# Patient Record
Sex: Male | Born: 1995
Health system: Southern US, Community
[De-identification: ages and names within clinical notes are randomized; demographics above are authoritative.]

## PROBLEM LIST (undated history)

## (undated) DIAGNOSIS — J302 Other seasonal allergic rhinitis: Secondary | ICD-10-CM

## (undated) DIAGNOSIS — F909 Attention-deficit hyperactivity disorder, unspecified type: Secondary | ICD-10-CM

## (undated) DIAGNOSIS — F913 Oppositional defiant disorder: Secondary | ICD-10-CM

## (undated) HISTORY — DX: Attention-deficit hyperactivity disorder, unspecified type: F90.9

## (undated) HISTORY — DX: Oppositional defiant disorder: F91.3

## (undated) HISTORY — DX: Other seasonal allergic rhinitis: J30.2

---
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
## 2014-03-07 ENCOUNTER — Emergency Department (HOSPITAL_COMMUNITY)
Admission: EM | Admit: 2014-03-07 | Discharge: 2014-03-07 | Disposition: A | Discharge status: Home / Self Care | Payer: Medicaid Other | Source: Home / Self Care | Attending: Emergency Medicine | Admitting: Emergency Medicine

## 2014-03-07 ENCOUNTER — Encounter (HOSPITAL_COMMUNITY): Payer: Self-pay | Admitting: Emergency Medicine

## 2014-03-07 DIAGNOSIS — J00 Acute nasopharyngitis [common cold]: Secondary | ICD-10-CM

## 2014-03-07 MED ORDER — IPRATROPIUM BROMIDE 0.06 % NA SOLN: 2.0000 | NASAL | Status: DC | PRN

## 2014-03-07 NOTE — Discharge Instructions (Signed)
Upper Respiratory Infection, Adult An upper respiratory infection (URI) is also sometimes known as the common cold. The upper respiratory tract includes the nose, sinuses, throat, trachea, and bronchi. Bronchi are the airways leading to the lungs. Most people improve within 1 week, but symptoms can last up to 2 weeks. A residual cough may last even longer.  CAUSES Many different viruses can infect the tissues lining the upper respiratory tract. The tissues become irritated and inflamed and often become very moist. Mucus production is also common. A cold is contagious. You can easily spread the virus to others by oral contact. This includes kissing, sharing a glass, coughing, or sneezing. Touching your mouth or nose and then touching a surface, which is then touched by another person, can also spread the virus. SYMPTOMS  Symptoms typically develop 1 to 3 days after you come in contact with a cold virus. Symptoms vary from person to person. They may include:  Runny nose.  Sneezing.  Nasal congestion.  Sinus irritation.  Sore throat.  Loss of voice (laryngitis).  Cough.  Fatigue.  Muscle aches.  Loss of appetite.  Headache.  Low-grade fever. DIAGNOSIS  You might diagnose your own cold based on familiar symptoms, since most people get a cold 2 to 3 times a year. Your caregiver can confirm this based on your exam. Most importantly, your caregiver can check that your symptoms are not due to another disease such as strep throat, sinusitis, pneumonia, asthma, or epiglottitis. Blood tests, throat tests, and X-rays are not necessary to diagnose a common cold, but they may sometimes be helpful in excluding other more serious diseases. Your caregiver will decide if any further tests are required. RISKS AND COMPLICATIONS  You may be at risk for a more severe case of the common cold if you smoke cigarettes, have chronic heart disease (such as heart failure) or lung disease (such as asthma), or if  you have a weakened immune system. The very young and very old are also at risk for more serious infections. Bacterial sinusitis, middle ear infections, and bacterial pneumonia can complicate the common cold. The common cold can worsen asthma and chronic obstructive pulmonary disease (COPD). Sometimes, these complications can require emergency medical care and may be life-threatening. PREVENTION  The best way to protect against getting a cold is to practice good hygiene. Avoid oral or hand contact with people with cold symptoms. Wash your hands often if contact occurs. There is no clear evidence that vitamin C, vitamin E, echinacea, or exercise reduces the chance of developing a cold. However, it is always recommended to get plenty of rest and practice good nutrition. TREATMENT  Treatment is directed at relieving symptoms. There is no cure. Antibiotics are not effective, because the infection is caused by a virus, not by bacteria. Treatment may include:  Increased fluid intake. Sports drinks offer valuable electrolytes, sugars, and fluids.  Breathing heated mist or steam (vaporizer or shower).  Eating chicken soup or other clear broths, and maintaining good nutrition.  Getting plenty of rest.  Using gargles or lozenges for comfort.  Controlling fevers with ibuprofen or acetaminophen as directed by your caregiver.  Increasing usage of your inhaler if you have asthma. Zinc gel and zinc lozenges, taken in the first 24 hours of the common cold, can shorten the duration and lessen the severity of symptoms. Pain medicines may help with fever, muscle aches, and throat pain. A variety of non-prescription medicines are available to treat congestion and runny nose. Your caregiver   can make recommendations and may suggest nasal or lung inhalers for other symptoms.  HOME CARE INSTRUCTIONS   Only take over-the-counter or prescription medicines for pain, discomfort, or fever as directed by your  caregiver.  Use a warm mist humidifier or inhale steam from a shower to increase air moisture. This may keep secretions moist and make it easier to breathe.  Drink enough water and fluids to keep your urine clear or pale yellow.  Rest as needed.  Return to work when your temperature has returned to normal or as your caregiver advises. You may need to stay home longer to avoid infecting others. You can also use a face mask and careful hand washing to prevent spread of the virus. SEEK MEDICAL CARE IF:   After the first few days, you feel you are getting worse rather than better.  You need your caregiver's advice about medicines to control symptoms.  You develop chills, worsening shortness of breath, or brown or red sputum. These may be signs of pneumonia.  You develop yellow or brown nasal discharge or pain in the face, especially when you bend forward. These may be signs of sinusitis.  You develop a fever, swollen neck glands, pain with swallowing, or white areas in the back of your throat. These may be signs of strep throat. SEEK IMMEDIATE MEDICAL CARE IF:   You have a fever.  You develop severe or persistent headache, ear pain, sinus pain, or chest pain.  You develop wheezing, a prolonged cough, cough up blood, or have a change in your usual mucus (if you have chronic lung disease).  You develop sore muscles or a stiff neck. Document Released: 06/28/2000 Document Revised: 03/27/2011 Document Reviewed: 04/09/2013 Exit[employer] Patient Information 2015 [employer]. This information is not intended to replace advice given to you by your health care provider. Make sure you discuss any questions you have with your health care provider.  

## 2014-03-07 NOTE — ED Provider Notes (Signed)
CSN: [id number]     Arrival date & time 03/07/14  1213 History   First MD Initiated Contact with Patient 03/07/14 1232     Chief Complaint  Patient presents with  . Cough   (Consider location/radiation/quality/duration/timing/severity/associated sxs/prior Treatment) HPI      19 year old male presents for evaluation of runny nose and a mild cough. This started yesterday. He is not taking any over-the-counter medications for treatment. No fever, shortness of breath, NVD, or sinus pressure. No recent travel. He has sick contacts.  Past Medical History  Diagnosis Date  . ADHD (attention deficit hyperactivity disorder)   . Oppositional defiant disorder   . Asthma   . Seasonal allergies    History reviewed. No pertinent past surgical history. Family History  Problem Relation Age of Onset  . ADD / ADHD Mother   . Depression Mother    History  Substance Use Topics  . Smoking status: Current Every Day Smoker  . Smokeless tobacco: Not on file  . Alcohol Use: Yes    Review of Systems  Constitutional: Negative for chills and fatigue.  HENT: Positive for congestion, rhinorrhea and sore throat. Negative for ear pain.   Respiratory: Positive for cough. Negative for shortness of breath.   Cardiovascular: Negative for chest pain.  All other systems reviewed and are negative.   Allergies  Review of patient's allergies indicates no known allergies.  Home Medications   Prior to Admission medications   Medication Sig Start Date End Date Taking? Authorizing Provider  albuterol (PROVENTIL HFA;VENTOLIN HFA) 108 (90 BASE) MCG/ACT inhaler Inhale 2 puffs into the lungs every 6 (six) hours as needed for wheezing.    Historical Provider, MD  beclomethasone (QVAR) 80 MCG/ACT inhaler Inhale 1 puff into the lungs daily as needed. For shortness of breath    Historical Provider, MD  fexofenadine (ALLEGRA) 180 MG tablet Take 180 mg by mouth daily.    Historical Provider, MD  ipratropium (ATROVENT) 0.06  % nasal spray Place 2 sprays into both nostrils every 4 (four) hours as needed for rhinitis. 03/07/14   Graylon GoodZachary H Jackie Russman, PA-C  Multiple Vitamin (MULTIVITAMIN WITH MINERALS) TABS Take 1 tablet by mouth daily.    Historical Provider, MD  predniSONE (DELTASONE) 20 MG tablet Take 3 tablets (60 mg total) by mouth daily. 05/10/12   Sunnie NielsenBrian Opitz, MD  vitamin C (ASCORBIC ACID) 500 MG tablet Take 500 mg by mouth daily.    Historical Provider, MD   BP 126/69 mmHg  Pulse 96  Temp(Src) 98.2 F (36.8 C) (Oral)  Resp 18  SpO2 100% Physical Exam  Constitutional: He is oriented to person, place, and time. He appears well-developed and well-nourished. No distress.  HENT:  Head: Normocephalic and atraumatic.  Right Ear: External ear normal.  Left Ear: External ear normal.  Nose: Nose normal.  Mouth/Throat: Oropharynx is clear and moist. No oropharyngeal exudate.  Cardiovascular: Normal rate, regular rhythm and normal heart sounds.   Pulmonary/Chest: Effort normal and breath sounds normal. No respiratory distress.  Lymphadenopathy:    He has no cervical adenopathy.  Neurological: He is alert and oriented to person, place, and time. Coordination normal.  Skin: Skin is warm and dry. No rash noted. He is not diaphoretic.  Psychiatric: He has a normal mood and affect. Judgment normal.  Nursing note and vitals reviewed.   ED Course  Procedures (including critical care time) Labs Review Labs Reviewed - No data to display  Imaging Review No results found.   MDM  1. Acute nasopharyngitis (common cold)    He has a cold, I will prescribe Atrovent nasal spray to help with the rhinorrhea, otherwise follow-up when necessary  Meds ordered this encounter  Medications  . ipratropium (ATROVENT) 0.06 % nasal spray    Sig: Place 2 sprays into both nostrils every 4 (four) hours as needed for rhinitis.    Dispense:  15 mL    Refill:  1       Graylon Good, PA-C 03/07/14 1314

## 2014-03-07 NOTE — ED Notes (Signed)
Pt states that for the past 2 days he has had a cough, nose has been congested, along with him sneezing. Pt denies any nausea, emesis and diarrhea.

## 2014-04-17 ENCOUNTER — Encounter (HOSPITAL_COMMUNITY): Payer: Self-pay

## 2014-04-17 ENCOUNTER — Emergency Department (HOSPITAL_COMMUNITY)
Admission: EM | Admit: 2014-04-17 | Discharge: 2014-04-17 | Disposition: A | Discharge status: Home / Self Care | Payer: Medicaid Other | Attending: Emergency Medicine | Admitting: Emergency Medicine

## 2014-04-17 DIAGNOSIS — J45901 Unspecified asthma with (acute) exacerbation: Secondary | ICD-10-CM

## 2014-04-17 DIAGNOSIS — Z7951 Long term (current) use of inhaled steroids: Secondary | ICD-10-CM | POA: Insufficient documentation

## 2014-04-17 DIAGNOSIS — Z7952 Long term (current) use of systemic steroids: Secondary | ICD-10-CM | POA: Insufficient documentation

## 2014-04-17 DIAGNOSIS — Z79899 Other long term (current) drug therapy: Secondary | ICD-10-CM | POA: Insufficient documentation

## 2014-04-17 DIAGNOSIS — Z8639 Personal history of other endocrine, nutritional and metabolic disease: Secondary | ICD-10-CM | POA: Insufficient documentation

## 2014-04-17 DIAGNOSIS — Z720 Tobacco use: Secondary | ICD-10-CM | POA: Insufficient documentation

## 2014-04-17 MED ORDER — ALBUTEROL SULFATE (2.5 MG/3ML) 0.083% IN NEBU
5.0000 mg | INHALATION_SOLUTION | Freq: Once | RESPIRATORY_TRACT | Status: AC
  Administered 2014-04-17: 5 mg via RESPIRATORY_TRACT
  Filled 2014-04-17: qty 6

## 2014-04-17 MED ORDER — IPRATROPIUM-ALBUTEROL 0.5-2.5 (3) MG/3ML IN SOLN
3.0000 mL | Freq: Once | RESPIRATORY_TRACT | Status: AC
  Administered 2014-04-17: 3 mL via RESPIRATORY_TRACT
  Filled 2014-04-17: qty 3

## 2014-04-17 MED ORDER — ALBUTEROL SULFATE HFA 108 (90 BASE) MCG/ACT IN AERS: 1.0000 | INHALATION_SPRAY | Freq: Four times a day (QID) | RESPIRATORY_TRACT | Status: DC | PRN

## 2014-04-17 MED ORDER — DEXAMETHASONE 4 MG PO TABS
10.0000 mg | ORAL_TABLET | Freq: Once | ORAL | Status: AC
  Administered 2014-04-17: 10 mg via ORAL
  Filled 2014-04-17: qty 2
  Filled 2014-04-17: qty 1

## 2014-04-17 NOTE — ED Provider Notes (Signed)
CSN: [id number]     Arrival date & time 04/17/14  0419 History   First MD Initiated Contact with Patient 04/17/14 [phone]     Chief Complaint  Patient presents with  . Asthma     (Consider location/radiation/quality/duration/timing/severity/associated sxs/prior Treatment) Patient is a 19 y.o. male presenting with asthma. The history is provided by the patient.  Asthma This is a recurrent problem. The current episode started yesterday. The problem occurs constantly. The problem has not changed since onset.Associated symptoms include shortness of breath. Pertinent negatives include no chest pain, no abdominal pain and no headaches. Nothing aggravates the symptoms. Nothing relieves the symptoms. He has tried nothing for the symptoms. The treatment provided no relief.    Past Medical History  Diagnosis Date  . ADHD (attention deficit hyperactivity disorder)   . Oppositional defiant disorder   . Asthma   . Seasonal allergies    History reviewed. No pertinent past surgical history. Family History  Problem Relation Age of Onset  . ADD / ADHD Mother   . Depression Mother    History  Substance Use Topics  . Smoking status: Current Every Day Smoker  . Smokeless tobacco: Not on file  . Alcohol Use: Yes    Review of Systems  Constitutional: Negative for fever.  HENT: Negative for drooling and rhinorrhea.   Eyes: Negative for pain.  Respiratory: Positive for cough, shortness of breath and wheezing.   Cardiovascular: Negative for chest pain and leg swelling.  Gastrointestinal: Negative for nausea, vomiting, abdominal pain and diarrhea.  Genitourinary: Negative for dysuria and hematuria.  Musculoskeletal: Negative for gait problem and neck pain.  Skin: Negative for color change.  Neurological: Negative for numbness and headaches.  Hematological: Negative for adenopathy.  Psychiatric/Behavioral: Negative for behavioral problems.  All other systems reviewed and are  negative.     Allergies  Review of patient's allergies indicates no known allergies.  Home Medications   Prior to Admission medications   Medication Sig Start Date End Date Taking? Authorizing Provider  albuterol (PROVENTIL HFA;VENTOLIN HFA) 108 (90 BASE) MCG/ACT inhaler Inhale 2 puffs into the lungs every 6 (six) hours as needed for wheezing.   Yes Historical Provider, MD  beclomethasone (QVAR) 80 MCG/ACT inhaler Inhale 1 puff into the lungs daily as needed. For shortness of breath   Yes Historical Provider, MD  fexofenadine (ALLEGRA) 180 MG tablet Take 180 mg by mouth daily.   Yes Historical Provider, MD  ipratropium (ATROVENT) 0.06 % nasal spray Place 2 sprays into both nostrils every 4 (four) hours as needed for rhinitis. 03/07/14  Yes Adrian Blackwater Baker, PA-C  montelukast (SINGULAIR) 10 MG tablet Take 10 mg by mouth at bedtime.   Yes Historical Provider, MD  Multiple Vitamin (MULTIVITAMIN WITH MINERALS) TABS Take 1 tablet by mouth daily.   Yes Historical Provider, MD  vitamin C (ASCORBIC ACID) 500 MG tablet Take 500 mg by mouth daily.   Yes Historical Provider, MD  predniSONE (DELTASONE) 20 MG tablet Take 3 tablets (60 mg total) by mouth daily. Patient not taking: Reported on 04/17/2014 05/10/12   Sunnie Nielsen, MD   BP 113/66 mmHg  Pulse 99  Temp(Src) 99.1 F (37.3 C) (Oral)  Resp 18  SpO2 100% Physical Exam  Constitutional: He is oriented to person, place, and time. He appears well-developed and well-nourished.  HENT:  Head: Normocephalic and atraumatic.  Right Ear: External ear normal.  Left Ear: External ear normal.  Nose: Nose normal.  Mouth/Throat: Oropharynx is clear and moist.  No oropharyngeal exudate.  Eyes: Conjunctivae and EOM are normal. Pupils are equal, round, and reactive to light.  Neck: Normal range of motion. Neck supple.  Cardiovascular: Normal rate, regular rhythm, normal heart sounds and intact distal pulses.  Exam reveals no gallop and no friction rub.   No  murmur heard. Pulmonary/Chest: Effort normal. No respiratory distress. He has wheezes (mild expiratory wheeze heard bilaterally.).  Abdominal: Soft. Bowel sounds are normal. He exhibits no distension. There is no tenderness. There is no rebound and no guarding.  Musculoskeletal: Normal range of motion. He exhibits no edema or tenderness.  Neurological: He is alert and oriented to person, place, and time.  Skin: Skin is warm and dry.  Psychiatric: He has a normal mood and affect. His behavior is normal.  Nursing note and vitals reviewed.   ED Course  Procedures (including critical care time) Labs Review Labs Reviewed - No data to display  Imaging Review No results found.   EKG Interpretation None      MDM   Final diagnoses:  Asthma exacerbation    6:41 AM 19 y.o. male with a history of asthma who presents with cough since yesterday morning. He also notes some intermittent shortness of breath throughout the day yesterday which worsened last night. His symptoms are consistent with previous asthma exacerbations. He notes that he has not had an inhaler in the last 1-2 weeks. He is afebrile and vital signs are unremarkable here. He feels better after breathing treatment but requests another. Faint expiratory wheezing heard on exam. Given another breathing treatment and a dose of Decadron here.  7:06 AM:  I have discussed the diagnosis/risks/treatment options with the patient and believe the pt to be eligible for discharge home to follow-up with his pcp as needed. We also discussed returning to the ED immediately if new or worsening sx occur. We discussed the sx which are most concerning (e.g., worsening sob, cp, fever) that necessitate immediate return. Medications administered to the patient during their visit and any new prescriptions provided to the patient are listed below.  Medications given during this visit Medications  ipratropium-albuterol (DUONEB) 0.5-2.5 (3) MG/3ML nebulizer  solution 3 mL (3 mLs Nebulization Given 04/17/14 0445)  dexamethasone (DECADRON) tablet 10 mg (10 mg Oral Given 04/17/14 0640)  albuterol (PROVENTIL) (2.5 MG/3ML) 0.083% nebulizer solution 5 mg (5 mg Nebulization Given 04/17/14 0640)    New Prescriptions   ALBUTEROL (PROVENTIL HFA;VENTOLIN HFA) 108 (90 BASE) MCG/ACT INHALER    Inhale 1-2 puffs into the lungs every 6 (six) hours as needed for wheezing or shortness of breath.     Purvis SheffieldForrest Clinton Wahlberg, MD 04/18/14 1058

## 2014-04-17 NOTE — ED Notes (Signed)
Pt states he has a inhaler her usually uses at home but it was left in his work bag which is in a coworkers car and he has not been able to get it, states during this time of year his asthma flares up, dry cough noted.

## 2014-04-17 NOTE — ED Notes (Signed)
Pt states he started coughing and wheezing yesterday, he left his inhaler at work

## 2014-04-17 NOTE — ED Notes (Signed)
Pt feels better after breathing treatment

## 2014-04-17 NOTE — Discharge Instructions (Signed)

## 2016-04-06 ENCOUNTER — Encounter: Payer: Self-pay | Admitting: Family Medicine

## 2016-04-06 ENCOUNTER — Ambulatory Visit: Payer: Self-pay | Attending: Family Medicine | Admitting: Family Medicine

## 2016-04-06 VITALS — BP 111/71 | HR 83 | Temp 98.3°F | Resp 18 | Ht 67.0 in | Wt 168.8 lb

## 2016-04-06 DIAGNOSIS — H532 Diplopia: Secondary | ICD-10-CM | POA: Insufficient documentation

## 2016-04-06 DIAGNOSIS — J4530 Mild persistent asthma, uncomplicated: Secondary | ICD-10-CM

## 2016-04-06 DIAGNOSIS — J45909 Unspecified asthma, uncomplicated: Secondary | ICD-10-CM | POA: Insufficient documentation

## 2016-04-06 DIAGNOSIS — T50905A Adverse effect of unspecified drugs, medicaments and biological substances, initial encounter: Secondary | ICD-10-CM

## 2016-04-06 DIAGNOSIS — J302 Other seasonal allergic rhinitis: Secondary | ICD-10-CM

## 2016-04-06 DIAGNOSIS — Z7689 Persons encountering health services in other specified circumstances: Secondary | ICD-10-CM | POA: Insufficient documentation

## 2016-04-06 DIAGNOSIS — Z79899 Other long term (current) drug therapy: Secondary | ICD-10-CM | POA: Insufficient documentation

## 2016-04-06 DIAGNOSIS — H538 Other visual disturbances: Secondary | ICD-10-CM

## 2016-04-06 DIAGNOSIS — F1290 Cannabis use, unspecified, uncomplicated: Secondary | ICD-10-CM | POA: Insufficient documentation

## 2016-04-06 MED ORDER — FLUTICASONE PROPIONATE HFA 44 MCG/ACT IN AERO: 2.0000 | INHALATION_SPRAY | Freq: Two times a day (BID) | RESPIRATORY_TRACT | 12 refills | Status: AC

## 2016-04-06 MED ORDER — FLUTICASONE PROPIONATE 50 MCG/ACT NA SUSP: 2.0000 | Freq: Every day | NASAL | 6 refills | Status: AC

## 2016-04-06 MED ORDER — ALBUTEROL SULFATE HFA 108 (90 BASE) MCG/ACT IN AERS: 2.0000 | INHALATION_SPRAY | Freq: Four times a day (QID) | RESPIRATORY_TRACT | 2 refills | Status: AC | PRN

## 2016-04-06 MED ORDER — MONTELUKAST SODIUM 10 MG PO TABS: 10.0000 mg | ORAL_TABLET | Freq: Every day | ORAL | 6 refills | Status: AC

## 2016-04-06 MED FILL — !VENTOLIN HFA INHALER: 108 (90 BAS | 25 days supply | Qty: 18 | Fill #0

## 2016-04-06 MED FILL — FLUTICASONE PROP 50 MCG SPR: 50 | 30 days supply | Qty: 16 | Fill #0

## 2016-04-06 MED FILL — MONTELUKAST SOD 10 MG TAB: 10 | 30 days supply | Qty: 30 | Fill #0

## 2016-04-06 MED FILL — !FLOVENT HFA 44 MCG INHALER: 44 MCG | 30 days supply | Qty: 1 | Fill #0

## 2016-04-06 NOTE — Progress Notes (Signed)
Patient is here for Asthma & allergies  Patient has taking his allergies medication for today  Patient denies pain for today  Patient has eaten today

## 2016-04-06 NOTE — Patient Instructions (Addendum)
Asthma Attack Prevention, Adult Although you may not be able to control the fact that you have asthma, you can take actions to prevent episodes of asthma (asthma attacks). These actions include:  Creating a written plan for managing and treating your asthma attacks (asthma action plan).  Monitoring your asthma.  Avoiding things that can irritate your airways or make your asthma symptoms worse (asthma triggers).  Taking your medicines as directed.  Acting quickly if you have signs or symptoms of an asthma attack. What are some ways to prevent an asthma attack? Create a plan Work with your health care provider to create an asthma action plan. This plan should include:  A list of your asthma triggers and how to avoid them.  A list of symptoms that you experience during an asthma attack.  Information about when to take medicine and how much medicine to take.  Information to help you understand your peak flow measurements.  Contact information for your health care providers.  Daily actions that you can take to control asthma. Monitor your asthma   To monitor your asthma:  Use your peak flow meter every morning and every evening for 2-3 weeks. Record the results in a journal. A drop in your peak flow numbers on one or more days may mean that you are starting to have an asthma attack, even if you are not having symptoms.  When you have asthma symptoms, write them down in a journal. Avoid asthma triggers   Work with your health care provider to find out what your asthma triggers are. This can be done by:  Being tested for allergies.  Keeping a journal that notes when asthma attacks occur and what may have contributed to them.  Asking your health care provider whether other medical conditions make your asthma worse. Common asthma triggers include:  Dust.  Smoke. This includes campfire smoke and secondhand smoke from tobacco products.  Pet dander.  Trees, grasses or  pollens.  Very cold, dry, or humid air.  Mold.  Foods that contain high amounts of sulfites.  Strong smells.  Engine exhaust and air pollution.  Aerosol sprays and fumes from household cleaners.  Household pests and their droppings, including dust mites and cockroaches.  Certain medicines, including NSAIDs. Once you have determined your asthma triggers, take steps to avoid them. Depending on your triggers, you may be able to reduce the chance of an asthma attack by:  Keeping your home clean. Have someone dust and vacuum your home for you 1 or 2 times a week. If possible, have them use a high-efficiency particulate arrestance (HEPA) vacuum.  Washing your sheets weekly in hot water.  Using allergy-proof mattress covers and casings on your bed.  Keeping pets out of your home.  Taking care of mold and water problems in your home.  Avoiding areas where people smoke.  Avoiding using strong perfumes or odor sprays.  Avoid spending a lot of time outdoors when pollen counts are high and on very windy days.  Talking with your health care provider before stopping or starting any new medicines. Medicines Take over-the-counter and prescription medicines only as told by your health care provider. Many asthma attacks can be prevented by carefully following your medicine schedule. Taking your medicines correctly is especially important when you cannot avoid certain asthma triggers. Even if you are doing well, do not stop taking your medicine and do not take less medicine. Act quickly If an asthma attack happens, acting quickly can decrease how severe   it is and how long it lasts. Take these actions:  Pay attention to your symptoms. If you are coughing, wheezing, or having difficulty breathing, do not wait to see if your symptoms go away on their own. Follow your asthma action plan.  If you have followed your asthma action plan and your symptoms are not improving, call your health care  provider or seek immediate medical care at the nearest hospital. It is important to write down how often you need to use your fast-acting rescue inhaler. You can track how often you use an inhaler in your journal. If you are using your rescue inhaler more often, it may mean that your asthma is not under control. Adjusting your asthma treatment plan may help you to prevent future asthma attacks and help you to gain better control of your condition. How can I prevent an asthma attack when I exercise?   Exercise is a common asthma trigger. To prevent asthma attacks during exercise:  Follow advice from your health care provider about whether you should use your fast-acting inhaler before exercising. Many people with asthma experience exercise-induced bronchoconstriction (EIB). This condition often worsens during vigorous exercise in cold, humid, or dry environments. Usually, people with EIB can stay very active by using a fast-acting inhaler before exercising.  Avoid exercising outdoors in very cold or humid weather.  Avoid exercising outdoors when pollen counts are high.  Warm up and cool down when exercising.  Stop exercising right away if asthma symptoms start. Consider taking part in exercises that are less likely to cause asthma symptoms such as:  Indoor swimming.  Biking.  Walking.  Hiking.  Playing football. This information is not intended to replace advice given to you by your health care provider. Make sure you discuss any questions you have with your health care provider. Document Released: 12/21/2008 Document Revised: 09/03/2015 Document Reviewed: 06/19/2015 Elsevier Interactive Patient Education  2017 [employer]  

## 2016-04-06 NOTE — Progress Notes (Signed)
Subjective:  Patient ID: Christian Barajas, male    DOB: 1995-12-09  Age: 21 y.o. MRN: [id number]  CC: Establish Care   HPI Christian Barajas presents for   Asthma & Allegies: He reports being diagnosed with asthma at age 47. Last time he used his rescue inhaler was early Fall 2017. Aggravating factors include harsh chemicals, grasses, pollen, aand plants. Denies any current symptoms. Reports when symptoms occur they worsen in the spring months.   Vision change: He reports 1 week ago one episode of double vision when he was looking in the mirror at raised his head up. Denies any difficulty maintaining his balance, N/V, or headaches. He was asked about drug/alcohol use. Patient reports smoking THC earlier that same day. Denies any episodes after.    Outpatient Medications Prior to Visit  Medication Sig Dispense Refill  . fexofenadine (ALLEGRA) 180 MG tablet Take 180 mg by mouth daily.    . Multiple Vitamin (MULTIVITAMIN WITH MINERALS) TABS Take 1 tablet by mouth daily.    . predniSONE (DELTASONE) 20 MG tablet Take 3 tablets (60 mg total) by mouth daily. (Patient not taking: Reported on 04/17/2014) 15 tablet 0  . vitamin C (ASCORBIC ACID) 500 MG tablet Take 500 mg by mouth daily.    Marland Kitchen albuterol (PROVENTIL HFA;VENTOLIN HFA) 108 (90 BASE) MCG/ACT inhaler Inhale 2 puffs into the lungs every 6 (six) hours as needed for wheezing.    Marland Kitchen albuterol (PROVENTIL HFA;VENTOLIN HFA) 108 (90 BASE) MCG/ACT inhaler Inhale 1-2 puffs into the lungs every 6 (six) hours as needed for wheezing or shortness of breath. 1 Inhaler 1  . beclomethasone (QVAR) 80 MCG/ACT inhaler Inhale 1 puff into the lungs daily as needed. For shortness of breath    . ipratropium (ATROVENT) 0.06 % nasal spray Place 2 sprays into both nostrils every 4 (four) hours as needed for rhinitis. 15 mL 1  . montelukast (SINGULAIR) 10 MG tablet Take 10 mg by mouth at bedtime.     No facility-administered medications prior to visit.     ROS Review of  Systems  Eyes: Positive for visual disturbance (1 week ago ).  Respiratory: Negative.   Cardiovascular: Negative.   Gastrointestinal: Negative.     Objective:  BP 111/71 (BP Location: Left Arm, Patient Position: Sitting, Cuff Size: Normal)   Pulse 83   Temp 98.3 F (36.8 C) (Oral)   Resp 18   Ht 5\' 7"  (1.702 m)   Wt 168 lb 12.8 oz (76.6 kg)   SpO2 98%   BMI 26.44 kg/m   BP/Weight 04/06/2016 04/17/2014 03/07/2014  Systolic BP 111 113 126  Diastolic BP 71 66 69  Wt. (Lbs) 168.8 - -  BMI 26.44 - -  Some encounter information is confidential and restricted. Go to Review Flowsheets activity to see all data.   Physical Exam  HENT:  Head: Normocephalic.  Right Ear: External ear normal.  Left Ear: External ear normal.  Nose: Nose normal.  Mouth/Throat: Oropharynx is clear and moist.  Eyes: Conjunctivae and EOM are normal. Pupils are equal, round, and reactive to light.  Cardiovascular: Normal rate, regular rhythm, normal heart sounds and intact distal pulses.   Pulmonary/Chest: Effort normal and breath sounds normal.  Nursing note and vitals reviewed.  Assessment & Plan:   Problem List Items Addressed This Visit      Respiratory   Asthma - Primary   Relevant Medications   albuterol (PROVENTIL HFA;VENTOLIN HFA) 108 (90 Base) MCG/ACT inhaler   fluticasone (  FLOVENT HFA) 44 MCG/ACT inhaler   montelukast (SINGULAIR) 10 MG tablet   Seasonal allergies   Relevant Medications   montelukast (SINGULAIR) 10 MG tablet   fluticasone (FLONASE) 50 MCG/ACT nasal spray    Other Visit Diagnoses    Drug-induced visual disturbance       -Vision acuity screening performed.      Meds ordered this encounter  Medications  . albuterol (PROVENTIL HFA;VENTOLIN HFA) 108 (90 Base) MCG/ACT inhaler    Sig: Inhale 2 puffs into the lungs every 6 (six) hours as needed for wheezing or shortness of breath.    Dispense:  1 Inhaler    Refill:  2    Order Specific Question:   Supervising Provider     Answer:   Quentin AngstJEGEDE, OLUGBEMIGA E [id number]  . fluticasone (FLOVENT HFA) 44 MCG/ACT inhaler    Sig: Inhale 2 puffs into the lungs 2 (two) times daily.    Dispense:  1 Inhaler    Refill:  12    Order Specific Question:   Supervising Provider    Answer:   Quentin AngstJEGEDE, OLUGBEMIGA E [id number]  . montelukast (SINGULAIR) 10 MG tablet    Sig: Take 1 tablet (10 mg total) by mouth at bedtime.    Dispense:  30 tablet    Refill:  6    Order Specific Question:   Supervising Provider    Answer:   Quentin AngstJEGEDE, OLUGBEMIGA E [id number]  . fluticasone (FLONASE) 50 MCG/ACT nasal spray    Sig: Place 2 sprays into both nostrils daily.    Dispense:  16 g    Refill:  6    Order Specific Question:   Supervising Provider    Answer:   Quentin AngstJEGEDE, OLUGBEMIGA E [[id number]]    Follow-up: Return in about 3 months (around 07/07/2016), or if symptoms worsen or fail to improve, for Asthma.   Christian BarkMandesia R Daelan Gatt FNP

## 2016-08-25 ENCOUNTER — Emergency Department (HOSPITAL_COMMUNITY)
Admission: EM | Admit: 2016-08-25 | Discharge: 2016-08-25 | Disposition: A | Discharge status: Home / Self Care | Payer: Self-pay | Attending: Emergency Medicine | Admitting: Emergency Medicine

## 2016-08-25 ENCOUNTER — Emergency Department (HOSPITAL_COMMUNITY): Payer: Self-pay

## 2016-08-25 ENCOUNTER — Encounter (HOSPITAL_COMMUNITY): Payer: Self-pay | Admitting: Nurse Practitioner

## 2016-08-25 DIAGNOSIS — J45909 Unspecified asthma, uncomplicated: Secondary | ICD-10-CM | POA: Insufficient documentation

## 2016-08-25 DIAGNOSIS — Z79899 Other long term (current) drug therapy: Secondary | ICD-10-CM | POA: Insufficient documentation

## 2016-08-25 DIAGNOSIS — F17210 Nicotine dependence, cigarettes, uncomplicated: Secondary | ICD-10-CM | POA: Insufficient documentation

## 2016-08-25 DIAGNOSIS — Z791 Long term (current) use of non-steroidal anti-inflammatories (NSAID): Secondary | ICD-10-CM | POA: Insufficient documentation

## 2016-08-25 DIAGNOSIS — Y929 Unspecified place or not applicable: Secondary | ICD-10-CM | POA: Insufficient documentation

## 2016-08-25 DIAGNOSIS — Y9389 Activity, other specified: Secondary | ICD-10-CM | POA: Insufficient documentation

## 2016-08-25 DIAGNOSIS — Y999 Unspecified external cause status: Secondary | ICD-10-CM | POA: Insufficient documentation

## 2016-08-25 DIAGNOSIS — S61212A Laceration without foreign body of right middle finger without damage to nail, initial encounter: Secondary | ICD-10-CM | POA: Insufficient documentation

## 2016-08-25 DIAGNOSIS — W271XXA Contact with garden tool, initial encounter: Secondary | ICD-10-CM | POA: Insufficient documentation

## 2016-08-25 MED ORDER — CEFAZOLIN SODIUM-DEXTROSE 1-4 GM/50ML-% IV SOLN
1.0000 g | Freq: Once | INTRAVENOUS | Status: AC
Start: 2016-08-25 — End: 2016-08-25
  Administered 2016-08-25: 1 g via INTRAVENOUS
  Filled 2016-08-25: qty 50

## 2016-08-25 MED ORDER — LIDOCAINE HCL (PF) 1 % IJ SOLN: 30.0000 mL | Freq: Once | INTRAMUSCULAR | Status: DC

## 2016-08-25 MED ORDER — CEPHALEXIN 500 MG PO CAPS: 500.0000 mg | ORAL_CAPSULE | Freq: Three times a day (TID) | ORAL | 0 refills | Status: AC

## 2016-08-25 MED ORDER — LIDOCAINE HCL 2 % IJ SOLN: 20.0000 mL | Freq: Once | INTRAMUSCULAR | Status: DC

## 2016-08-25 MED ORDER — TETANUS-DIPHTH-ACELL PERTUSSIS 5-2.5-18.5 LF-MCG/0.5 IM SUSP
0.5000 mL | Freq: Once | INTRAMUSCULAR | Status: AC
  Administered 2016-08-25: 0.5 mL via INTRAMUSCULAR
  Filled 2016-08-25: qty 0.5

## 2016-08-25 MED ORDER — OXYCODONE HCL 5 MG PO TABS: 5.0000 mg | ORAL_TABLET | Freq: Four times a day (QID) | ORAL | 0 refills | Status: AC | PRN

## 2016-08-25 MED ORDER — ACETAMINOPHEN 325 MG PO TABS: 650.0000 mg | ORAL_TABLET | Freq: Four times a day (QID) | ORAL | Status: AC | PRN

## 2016-08-25 MED ORDER — LIDOCAINE HCL 1 % IJ SOLN
INTRAMUSCULAR | Status: AC
  Administered 2016-08-25: 20 mL
  Filled 2016-08-25: qty 20

## 2016-08-25 MED ORDER — IBUPROFEN 200 MG PO TABS: 600.0000 mg | ORAL_TABLET | Freq: Four times a day (QID) | ORAL | Status: AC | PRN

## 2016-08-25 NOTE — Discharge Instructions (Signed)
Discharge Instructions   You have a dressing with a splint incorporated in it. Move your fingers as much as possible, making a full fist and fully opening the fist. Elevate your hand to reduce pain & swelling of the digits.  Ice over the operative site may be helpful to reduce pain & swelling.  DO NOT USE HEAT. Leave the dressing in place until you return to our office.  You may shower, but keep the bandage clean & dry.  You may drive a car when you are off of prescription pain medications and can safely control your vehicle with both hands. Our office will call you to arrange follow-up   Please call [phone] during normal business hours or [phone] after hours for any problems. Including the following:  - excessive redness of the incisions - drainage for more than 4 days - fever of more than 101.5 F  *Please note that pain medications will not be refilled after hours or on weekends.  WORK STATUS: KEEP RIGHT LONG FINGER BANDAGE DRY.  MAY ENGAGE IN ACTIVITIES AS HE IS ABLE WITH RIGHT HAND

## 2016-08-25 NOTE — ED Triage Notes (Signed)
Pt presents with a deep laceration to the right middle finger that he reports he sustained while using a hedge trimmer. Tetanus status unknown.

## 2016-08-25 NOTE — ED Notes (Signed)
Discharge instructions reviewed with patient. Patient verbalizes understanding. VSS.   

## 2016-08-25 NOTE — ED Provider Notes (Signed)
[facility]EM[facility]ENC[facility]PT Provider Note   CSN: [id number] Arrival date & time: 08/25/16  1935     History   Chief Complaint Chief Complaint  Patient presents with  . Finger Injury    Right middle finger    HPI Christian Barajas is a 21 y.o. male.  HPI   21 year old male presents with an injury to his right middle finger.  Patient was trimming trees with a hedge trimmer when he cut the distal right middle finger.  He notes immediate bleeding.  He denies any other injuries, tetanus unknown.  No medications prior to arrival.  Full active range of motion of the DIP PIP and MCP of the finger  Past Medical History:  Diagnosis Date  . ADHD (attention deficit hyperactivity disorder)   . Asthma   . Oppositional defiant disorder   . Seasonal allergies     Patient Active Problem List   Diagnosis Date Noted  . Asthma 04/06/2016  . Seasonal allergies 04/06/2016  . ADD (attention deficit disorder) without hyperactivity 01/19/2011  . ODD (oppositional defiant disorder) 01/19/2011    History reviewed. No pertinent surgical history.     Home Medications    Prior to Admission medications   Medication Sig Start Date End Date Taking? Authorizing Provider  acetaminophen (TYLENOL) 325 MG tablet Take 2 tablets (650 mg total) by mouth every 6 (six) hours as needed for mild pain or moderate pain. 08/25/16   Mack Hook, MD  albuterol (PROVENTIL HFA;VENTOLIN HFA) 108 (90 Base) MCG/ACT inhaler Inhale 2 puffs into the lungs every 6 (six) hours as needed for wheezing or shortness of breath. 04/06/16   Hairston, Oren Beckmann, FNP  cephALEXin (KEFLEX) 500 MG capsule Take 1 capsule (500 mg total) by mouth 3 (three) times daily. 08/25/16 08/28/16  Mack Hook, MD  fexofenadine (ALLEGRA) 180 MG tablet Take 180 mg by mouth daily.    [provider]  fluticasone (FLONASE) 50 MCG/ACT nasal spray Place 2 sprays into both nostrils daily. 04/06/16   Lizbeth Bark, FNP  fluticasone (FLOVENT HFA)  44 MCG/ACT inhaler Inhale 2 puffs into the lungs 2 (two) times daily. 04/06/16   Lizbeth Bark, FNP  ibuprofen (ADVIL) 200 MG tablet Take 3 tablets (600 mg total) by mouth every 6 (six) hours as needed for mild pain or moderate pain. 08/25/16   Mack Hook, MD  montelukast (SINGULAIR) 10 MG tablet Take 1 tablet (10 mg total) by mouth at bedtime. 04/06/16   Lizbeth Bark, FNP  Multiple Vitamin (MULTIVITAMIN WITH MINERALS) TABS Take 1 tablet by mouth daily.    [provider]  oxyCODONE (ROXICODONE) 5 MG immediate release tablet Take 1 tablet (5 mg total) by mouth every 6 (six) hours as needed for severe pain. 08/25/16   Mack Hook, MD  predniSONE (DELTASONE) 20 MG tablet Take 3 tablets (60 mg total) by mouth daily. Patient not taking: Reported on 04/17/2014 05/10/12   Sunnie Nielsen, MD  vitamin C (ASCORBIC ACID) 500 MG tablet Take 500 mg by mouth daily.    [provider]    Family History Family History  Problem Relation Age of Onset  . ADD / ADHD Mother   . Depression Mother     Social History Social History  Substance Use Topics  . Smoking status: Current Every Day Smoker  . Smokeless tobacco: Not on file  . Alcohol use Yes     Allergies   Patient has no known allergies.   Review of Systems Review of  Systems  All other systems reviewed and are negative.    Physical Exam Updated Vital Signs BP 129/77 (BP Location: Right Arm)   Pulse 71   Temp 97.8 F (36.6 C) (Oral)   Resp 18   SpO2 100%   Physical Exam  Constitutional: He is oriented to person, place, and time. He appears well-developed and well-nourished.  HENT:  Head: Normocephalic and atraumatic.  Eyes: Pupils are equal, round, and reactive to light. Conjunctivae are normal. Right eye exhibits no discharge. Left eye exhibits no discharge. No scleral icterus.  Neck: Normal range of motion. No JVD present. No tracheal deviation present.  Pulmonary/Chest: Effort normal. No stridor.   Musculoskeletal:  Stellate laceration through the palmar radial aspect of the right third digit distal to the DIP bleeding controlled with direct pressure  Neurological: He is alert and oriented to person, place, and time. Coordination normal.  Psychiatric: He has a normal mood and affect. His behavior is normal. Judgment and thought content normal.  Nursing note and vitals reviewed.   ED Treatments / Results  Labs (all labs ordered are listed, but only abnormal results are displayed) Labs Reviewed - No data to display  EKG  EKG Interpretation None       Radiology Dg Finger Middle Right  Result Date: 08/25/2016 CLINICAL DATA:  Injured right middle finger with a hedge trimmer. EXAM: RIGHT MIDDLE FINGER 2+V COMPARISON:  None. FINDINGS: There is a soft tissue injury involving the radial aspect of the distal phalanx of the long finger. Small fractures are also noted along the radial cortex of the distal phalanx. The joint spaces are maintained. No other fractures are identified. No radiopaque foreign body. IMPRESSION: Soft tissue injury and small fractures involving the distal phalanx of the right long finger. Electronically Signed   By: Rudie MeyerP.  Gallerani M.D.   On: 08/25/2016 20:49    Procedures Procedures (including critical care time)  NERVE BLOCK Performed by: Thermon LeylandHedges,Amair Shrout Todd Consent: Verbal consent obtained. Required items: required blood products, implants, devices, and special equipment available Time out: Immediately prior to procedure a "time out" was called to verify the correct patient, procedure, equipment, support staff and site/side marked as required.  Indication: Laceration repair Nerve block body site: Right middle finger  Preparation: Patient was prepped and draped in the usual sterile fashion. Needle gauge: 24 G Location technique: anatomical landmarks  Local anesthetic: 1%  Anesthetic total: 7 ml  Outcome: pain improved Patient tolerance: Patient  tolerated the procedure well with no immediate complications.  Medications Ordered in ED Medications  lidocaine (PF) (XYLOCAINE) 1 % injection 30 mL (not administered)  Tdap (BOOSTRIX) injection 0.5 mL (0.5 mLs Intramuscular Given 08/25/16 2048)  lidocaine (XYLOCAINE) 1 % (with pres) injection (20 mLs  Given by Other 08/25/16 2208)  ceFAZolin (ANCEF) IVPB 1 g/50 mL premix (0 g Intravenous Stopped 08/25/16 2238)     Initial Impression / Assessment and Plan / ED Course  I have reviewed the triage vital signs and the nursing notes.  Pertinent labs & imaging results that were available during my care of the patient were reviewed by me and considered in my medical decision making (see chart for details).      Final Clinical Impressions(s) / ED Diagnoses   Final diagnoses:  Laceration of right middle finger without foreign body without damage to nail, initial encounter   Labs:   Imaging:  Consults:  Therapeutics:  Discharge Meds:   Assessment/Plan: 21 year old male presents today with open fracture.  Hand  surgery was consulted who repaired finger here in the ED.  Patient given prophylactic antibiotics, oral antibiotics for home, follow-up as an outpatient with hand surgery.   New Prescriptions Discharge Medication List as of 08/25/2016 10:48 PM    START taking these medications   Details  acetaminophen (TYLENOL) 325 MG tablet Take 2 tablets (650 mg total) by mouth every 6 (six) hours as needed for mild pain or moderate pain., Starting Fri 08/25/2016, OTC    cephALEXin (KEFLEX) 500 MG capsule Take 1 capsule (500 mg total) by mouth 3 (three) times daily., Starting Fri 08/25/2016, Until Mon 08/28/2016, Print    ibuprofen (ADVIL) 200 MG tablet Take 3 tablets (600 mg total) by mouth every 6 (six) hours as needed for mild pain or moderate pain., Starting Fri 08/25/2016, OTC    oxyCODONE (ROXICODONE) 5 MG immediate release tablet Take 1 tablet (5 mg total) by mouth every 6 (six) hours as  needed for severe pain., Starting Fri 08/25/2016, Print         Johanny Segers, [place], PA-C 08/25/16 2311    Gerhard Munch, MD 08/26/16 [phone]

## 2016-08-25 NOTE — Consult Note (Signed)
ORTHOPAEDIC CONSULTATION HISTORY & PHYSICAL REQUESTING PHYSICIAN: Gerhard Munch, MD  Chief Complaint: R LF injury from hedge-trimmers  HPI: Christian Barajas is a 21 y.o. male who presented to the ED after accidentally injuring his R LF with hedgetrimmers.  He experienced immediate onset of pain was a wound on the volar radial aspect of the digit, distal to the DIP flexion crease.  Past Medical History:  Diagnosis Date  . ADHD (attention deficit hyperactivity disorder)   . Asthma   . Oppositional defiant disorder   . Seasonal allergies    History reviewed. No pertinent surgical history. Social History   Social History  . Marital status: Single    Spouse name: N/A  . Number of children: N/A  . Years of education: N/A   Social History Main Topics  . Smoking status: Current Every Day Smoker  . Smokeless tobacco: None  . Alcohol use Yes  . Drug use: Yes    Types: Marijuana     Comment: daily use  . Sexual activity: No   Other Topics Concern  . None   Social History Narrative  . None   Family History  Problem Relation Age of Onset  . ADD / ADHD Mother   . Depression Mother    No Known Allergies Prior to Admission medications   Medication Sig Start Date End Date Taking? Authorizing Provider  albuterol (PROVENTIL HFA;VENTOLIN HFA) 108 (90 Base) MCG/ACT inhaler Inhale 2 puffs into the lungs every 6 (six) hours as needed for wheezing or shortness of breath. 04/06/16   Hairston, Oren Beckmann, FNP  fexofenadine (ALLEGRA) 180 MG tablet Take 180 mg by mouth daily.    [provider]  fluticasone (FLONASE) 50 MCG/ACT nasal spray Place 2 sprays into both nostrils daily. 04/06/16   Lizbeth Bark, FNP  fluticasone (FLOVENT HFA) 44 MCG/ACT inhaler Inhale 2 puffs into the lungs 2 (two) times daily. 04/06/16   Lizbeth Bark, FNP  montelukast (SINGULAIR) 10 MG tablet Take 1 tablet (10 mg total) by mouth at bedtime. 04/06/16   Lizbeth Bark, FNP  Multiple  Vitamin (MULTIVITAMIN WITH MINERALS) TABS Take 1 tablet by mouth daily.    [provider]  predniSONE (DELTASONE) 20 MG tablet Take 3 tablets (60 mg total) by mouth daily. Patient not taking: Reported on 04/17/2014 05/10/12   Sunnie Nielsen, MD  vitamin C (ASCORBIC ACID) 500 MG tablet Take 500 mg by mouth daily.    [provider]   Dg Finger Middle Right  Result Date: 08/25/2016 CLINICAL DATA:  Injured right middle finger with a hedge trimmer. EXAM: RIGHT MIDDLE FINGER 2+V COMPARISON:  None. FINDINGS: There is a soft tissue injury involving the radial aspect of the distal phalanx of the long finger. Small fractures are also noted along the radial cortex of the distal phalanx. The joint spaces are maintained. No other fractures are identified. No radiopaque foreign body. IMPRESSION: Soft tissue injury and small fractures involving the distal phalanx of the right long finger. Electronically Signed   By: Rudie Meyer M.D.   On: 08/25/2016 20:49    Positive ROS: All other systems have been reviewed and were otherwise negative with the exception of those mentioned in the HPI and as above.  Physical Exam: Vitals: Refer to EMR. Constitutional:  WD, WN, NAD HEENT:  NCAT, EOMI Neuro/Psych:  Alert & oriented to person, place, and time; appropriate mood & affect Lymphatic: No generalized extremity edema or lymphadenopathy Extremities / MSK:  The extremities are  normal with respect to appearance, ranges of motion, joint stability, muscle strength/tone, sensation, & perfusion except as otherwise noted:  Right long finger has already been anesthetized with a digital block.  The tip is blanched with tourniquet at the base of the digit, as he was being cleansed.  There is a stellate laceration of the pulp, distal to the DIP flexion crease, radial of the midline.  He can flex and extend the DIP joint actively and, of course, there is no pain with this.  He can also generated decent flexion power  with the tip against the examiner's finger.  The wound is clean.  It measures about 3 cm total, measuring all limbs.  Assessment: Right long finger pulp laceration, with underlying cortical fracture of the distal phalanx from hedge tremors  Plan: Digital block is already been performed and the wound has been copiously irrigated.  The flaps are reapproximated with 4-0 chromic interrupted sutures.  He tolerated this well.  The tourniquet was released, and the wound dressed.  A dorsal tongue blade splint was applied, allowing the MP and PIP joints full motion.  He will receive antibiotics here, and be discharged on a short course of oral antibiotics as a prophylactic measure, given the open nature and the skeletal injury.  My office will call him on Monday to arrange an appropriate follow-up.  Precautions and expectations were reviewed, analgesic plan developed.  Cliffton Astersavid A. Janee Mornhompson, MD      Orthopaedic & Hand Surgery [facility] [address] [place], [place]  [id number] Office: [phone] Mobile: [phone]  08/25/2016, 9:25 PM

## 2017-01-24 ENCOUNTER — Ambulatory Visit (HOSPITAL_COMMUNITY)
Admission: EM | Admit: 2017-01-24 | Discharge: 2017-01-24 | Disposition: A | Discharge status: Home / Self Care | Payer: Self-pay | Attending: Family Medicine | Admitting: Family Medicine

## 2017-01-24 ENCOUNTER — Other Ambulatory Visit: Payer: Self-pay

## 2017-01-24 ENCOUNTER — Encounter (HOSPITAL_COMMUNITY): Payer: Self-pay

## 2017-01-24 DIAGNOSIS — J45909 Unspecified asthma, uncomplicated: Secondary | ICD-10-CM | POA: Insufficient documentation

## 2017-01-24 DIAGNOSIS — J020 Streptococcal pharyngitis: Secondary | ICD-10-CM | POA: Insufficient documentation

## 2017-01-24 DIAGNOSIS — F17200 Nicotine dependence, unspecified, uncomplicated: Secondary | ICD-10-CM | POA: Insufficient documentation

## 2017-01-24 DIAGNOSIS — Z79899 Other long term (current) drug therapy: Secondary | ICD-10-CM | POA: Insufficient documentation

## 2017-01-24 LAB — POCT RAPID STREP A: Streptococcus, Group A Screen (Direct): NEGATIVE

## 2017-01-24 MED ORDER — PENICILLIN V POTASSIUM 500 MG PO TABS: 500.0000 mg | ORAL_TABLET | Freq: Two times a day (BID) | ORAL | 0 refills | Status: AC

## 2017-01-24 MED ORDER — PHENOL 1.4 % MT LIQD: 1.0000 | OROMUCOSAL | 0 refills | Status: AC | PRN

## 2017-01-24 NOTE — ED Triage Notes (Signed)
[facility] along with chills, pt has been taking OTC Medications but has no relief

## 2017-01-24 NOTE — Discharge Instructions (Signed)
Rapid strep negative, however, as discussed, given the way your throat looks, I am treating you empirically with antibiotics. Symptoms could also be due to virus as well. Start penicillin as directed. You can use phenol throat spray to help with pain. If you develop nasal symptoms such as runny nose, stuffy nose, you can take over the counter flonase and allergy medicine such as zyrtec, allegra, claritin to help with symptoms. Monitor for any worsening of symptoms, swelling of the throat, trouble breathing, trouble swallowing, follow up for reevaluation.

## 2017-01-24 NOTE — ED Provider Notes (Signed)
[facility]    CSN: [id number] Arrival date & time: 01/24/17  1759     History   Chief Complaint Chief Complaint  Patient presents with  . Sore Throat    HPI Christian Barajas is a 22 y.o. male.   22 year old male comes in for 3-day history of URI symptoms.  He has had sore throat along with chills. Denies rhinorrhea, nasal congestion. Denies cough. Has had some ear pain with swallowing. Fever with tmax 101.6, ibuprofen, last took this morning. otc medications without relief. Current every day smoker, THC, BID for 10 years.       Past Medical History:  Diagnosis Date  . ADHD (attention deficit hyperactivity disorder)   . Asthma   . Oppositional defiant disorder   . Seasonal allergies     Patient Active Problem List   Diagnosis Date Noted  . Asthma 04/06/2016  . Seasonal allergies 04/06/2016  . ADD (attention deficit disorder) without hyperactivity 01/19/2011  . ODD (oppositional defiant disorder) 01/19/2011    History reviewed. No pertinent surgical history.     Home Medications    Prior to Admission medications   Medication Sig Start Date End Date Taking? Authorizing Provider  fexofenadine (ALLEGRA) 180 MG tablet Take 180 mg by mouth daily.   Yes [provider]  ibuprofen (ADVIL) 200 MG tablet Take 3 tablets (600 mg total) by mouth every 6 (six) hours as needed for mild pain or moderate pain. 08/25/16  Yes Mack Hookhompson, David, MD  Multiple Vitamin (MULTIVITAMIN WITH MINERALS) TABS Take 1 tablet by mouth daily.   Yes [provider]  vitamin C (ASCORBIC ACID) 500 MG tablet Take 500 mg by mouth daily.   Yes [provider]  acetaminophen (TYLENOL) 325 MG tablet Take 2 tablets (650 mg total) by mouth every 6 (six) hours as needed for mild pain or moderate pain. 08/25/16   Mack Hookhompson, David, MD  albuterol (PROVENTIL HFA;VENTOLIN HFA) 108 (90 Base) MCG/ACT inhaler Inhale 2 puffs into the lungs every 6 (six) hours as needed for wheezing or  shortness of breath. 04/06/16   Hairston, Oren BeckmannMandesia R, FNP  fluticasone (FLONASE) 50 MCG/ACT nasal spray Place 2 sprays into both nostrils daily. 04/06/16   Lizbeth BarkHairston, Mandesia R, FNP  fluticasone (FLOVENT HFA) 44 MCG/ACT inhaler Inhale 2 puffs into the lungs 2 (two) times daily. 04/06/16   Lizbeth BarkHairston, Mandesia R, FNP  montelukast (SINGULAIR) 10 MG tablet Take 1 tablet (10 mg total) by mouth at bedtime. 04/06/16   Lizbeth BarkHairston, Mandesia R, FNP  oxyCODONE (ROXICODONE) 5 MG immediate release tablet Take 1 tablet (5 mg total) by mouth every 6 (six) hours as needed for severe pain. 08/25/16   Mack Hookhompson, David, MD  penicillin v potassium (VEETID) 500 MG tablet Take 1 tablet (500 mg total) by mouth 2 (two) times daily at 10 AM and 5 PM for 10 days. 01/24/17 02/03/17  Cathie HoopsYu, Amy V, PA-C  phenol (CHLORASEPTIC) 1.4 % LIQD Use as directed 1 spray in the mouth or throat as needed for throat irritation / pain. 01/24/17   Cathie HoopsYu, Amy V, PA-C  predniSONE (DELTASONE) 20 MG tablet Take 3 tablets (60 mg total) by mouth daily. Patient not taking: Reported on 04/17/2014 05/10/12   Sunnie Nielsenpitz, Brian, MD    Family History Family History  Problem Relation Age of Onset  . ADD / ADHD Mother   . Depression Mother     Social History Social History   Tobacco Use  . Smoking status: Current Every Day Smoker  .  Smokeless tobacco: Never Used  Substance Use Topics  . Alcohol use: Yes  . Drug use: Yes    Types: Marijuana    Comment: daily use     Allergies   Patient has no known allergies.   Review of Systems Review of Systems  Reason unable to perform ROS: See HPI as above.     Physical Exam Triage Vital Signs ED Triage Vitals  Enc Vitals Group     BP 01/24/17 1904 121/78     Pulse Rate 01/24/17 1904 86     Resp 01/24/17 1904 16     Temp 01/24/17 1904 100.1 F (37.8 C)     Temp Source 01/24/17 1904 Oral     SpO2 01/24/17 1904 99 %     Weight --      Height --      Head Circumference --      Peak Flow --      Pain Score  01/24/17 1905 7     Pain Loc --      Pain Edu? --      Excl. in GC? --    No data found.  Updated Vital Signs BP 121/78 (BP Location: Left Arm)   Pulse 86   Temp 100.1 F (37.8 C) (Oral)   Resp 16   SpO2 99%   Physical Exam  Constitutional: He is oriented to person, place, and time. He appears well-developed and well-nourished. No distress.  HENT:  Head: Normocephalic and atraumatic.  Right Ear: External ear normal.  Left Ear: External ear normal.  Nose: Nose normal. Right sinus exhibits no maxillary sinus tenderness and no frontal sinus tenderness. Left sinus exhibits no maxillary sinus tenderness and no frontal sinus tenderness.  Mouth/Throat: Uvula is midline, oropharynx is clear and moist and mucous membranes are normal. Tonsils are 3+ on the right. Tonsils are 2+ on the left. Tonsillar exudate (right).  Bilateral cerumen impaction, TM not visible.  Eyes: Conjunctivae are normal. Pupils are equal, round, and reactive to light.  Neck: Normal range of motion. Neck supple.  Cardiovascular: Normal rate, regular rhythm and normal heart sounds. Exam reveals no gallop and no friction rub.  No murmur heard. Pulmonary/Chest: Effort normal and breath sounds normal. He has no decreased breath sounds. He has no wheezes. He has no rhonchi. He has no rales.  Lymphadenopathy:    He has no cervical adenopathy.  Neurological: He is alert and oriented to person, place, and time.  Skin: Skin is warm and dry.  Psychiatric: He has a normal mood and affect. His behavior is normal. Judgment normal.     UC Treatments / Results  Labs (all labs ordered are listed, but only abnormal results are displayed) Labs Reviewed  CULTURE, GROUP A STREP [facility])  POCT RAPID STREP A    EKG  EKG Interpretation None       Radiology No results found.  Procedures Procedures (including critical care time)  Medications Ordered in UC Medications - No data to display   Initial Impression /  Assessment and Plan / UC Course  I have reviewed the triage vital signs and the nursing notes.  Pertinent labs & imaging results that were available during my care of the patient were reviewed by me and considered in my medical decision making (see chart for details).    Rapid strep negative, however, given history and exam, will treat empirically for tonsillitis with penicillin.  Strep culture sent.  Other symptomatic treatment discussed.  Return precautions  given.  Patient expresses understanding and agrees to plan.  Final Clinical Impressions(s) / UC Diagnoses   Final diagnoses:  Strep pharyngitis    ED Discharge Orders        Ordered    penicillin v potassium (VEETID) 500 MG tablet  2 times daily     01/24/17 1947    phenol (CHLORASEPTIC) 1.4 % LIQD  As needed     01/24/17 1947        Belinda Fisher, PA-C 01/24/17 1952

## 2017-01-26 LAB — CULTURE, GROUP A STREP (THRC)

## 2018-03-01 ENCOUNTER — Emergency Department (HOSPITAL_COMMUNITY)
Admission: EM | Admit: 2018-03-01 | Discharge: 2018-03-01 | Disposition: A | Discharge status: Home / Self Care | Payer: BLUE CROSS/BLUE SHIELD | Attending: Emergency Medicine | Admitting: Emergency Medicine

## 2018-03-01 ENCOUNTER — Encounter (HOSPITAL_COMMUNITY): Payer: Self-pay | Admitting: *Deleted

## 2018-03-01 ENCOUNTER — Other Ambulatory Visit: Payer: Self-pay

## 2018-03-01 DIAGNOSIS — F909 Attention-deficit hyperactivity disorder, unspecified type: Secondary | ICD-10-CM | POA: Insufficient documentation

## 2018-03-01 DIAGNOSIS — F913 Oppositional defiant disorder: Secondary | ICD-10-CM | POA: Diagnosis not present

## 2018-03-01 DIAGNOSIS — J45909 Unspecified asthma, uncomplicated: Secondary | ICD-10-CM | POA: Insufficient documentation

## 2018-03-01 DIAGNOSIS — F17200 Nicotine dependence, unspecified, uncomplicated: Secondary | ICD-10-CM | POA: Insufficient documentation

## 2018-03-01 DIAGNOSIS — J029 Acute pharyngitis, unspecified: Secondary | ICD-10-CM | POA: Diagnosis present

## 2018-03-01 DIAGNOSIS — Z79899 Other long term (current) drug therapy: Secondary | ICD-10-CM | POA: Insufficient documentation

## 2018-03-01 DIAGNOSIS — J111 Influenza due to unidentified influenza virus with other respiratory manifestations: Secondary | ICD-10-CM | POA: Diagnosis not present

## 2018-03-01 MED ORDER — BENZONATATE 200 MG PO CAPS: 200.0000 mg | ORAL_CAPSULE | Freq: Three times a day (TID) | ORAL | 0 refills | Status: AC | PRN

## 2018-03-01 MED ORDER — OSELTAMIVIR PHOSPHATE 75 MG PO CAPS: 75.0000 mg | ORAL_CAPSULE | Freq: Two times a day (BID) | ORAL | 0 refills | Status: AC

## 2018-03-01 MED ORDER — ONDANSETRON HCL 4 MG PO TABS: 4.0000 mg | ORAL_TABLET | Freq: Four times a day (QID) | ORAL | 0 refills | Status: AC

## 2018-03-01 MED ORDER — OSELTAMIVIR PHOSPHATE 75 MG PO CAPS
75.0000 mg | ORAL_CAPSULE | Freq: Once | ORAL | Status: AC
  Administered 2018-03-01: 75 mg via ORAL
  Filled 2018-03-01: qty 1

## 2018-03-01 NOTE — ED Triage Notes (Signed)
Pt stated "I started having flu symptoms on Wednesday.  I've had fever, cough, soreness.  My fever was 101.3 around 3-4 yesterday afternoon."  Pt denies n/v/d.

## 2018-03-01 NOTE — ED Provider Notes (Signed)
[facility]-EMERGENCY DEPT Provider Note   CSN: [id number] Arrival date & time: 03/01/18  0008     History   Chief Complaint Chief Complaint  Patient presents with  . flu-like symptoms    HPI Christian Barajas is a 23 y.o. male.  Patient presents to the emergency department for evaluation of flu symptoms.  Symptoms began 1-1/2 days ago.  He started with sore throat and then developed fever, cough, congestion.  Yesterday started having some nausea and vomiting, no diarrhea.  He does feel, however, that the vomiting has mostly been because of forceful coughing.  He denies abdominal pain.  He does not have shortness of breath.     Past Medical History:  Diagnosis Date  . ADHD (attention deficit hyperactivity disorder)   . Asthma   . Oppositional defiant disorder   . Seasonal allergies     Patient Active Problem List   Diagnosis Date Noted  . Asthma 04/06/2016  . Seasonal allergies 04/06/2016  . ADD (attention deficit disorder) without hyperactivity 01/19/2011  . ODD (oppositional defiant disorder) 01/19/2011    History reviewed. No pertinent surgical history.      Home Medications    Prior to Admission medications   Medication Sig Start Date End Date Taking? Authorizing Provider  acetaminophen (TYLENOL) 325 MG tablet Take 2 tablets (650 mg total) by mouth every 6 (six) hours as needed for mild pain or moderate pain. 08/25/16   Mack Hook, MD  albuterol (PROVENTIL HFA;VENTOLIN HFA) 108 (90 Base) MCG/ACT inhaler Inhale 2 puffs into the lungs every 6 (six) hours as needed for wheezing or shortness of breath. 04/06/16   Hairston, Oren Beckmann, FNP  benzonatate (TESSALON) 200 MG capsule Take 1 capsule (200 mg total) by mouth 3 (three) times daily as needed for cough. 03/01/18   Gilda Crease, MD  fexofenadine (ALLEGRA) 180 MG tablet Take 180 mg by mouth daily.    [provider]  fluticasone (FLONASE) 50 MCG/ACT nasal spray Place 2  sprays into both nostrils daily. 04/06/16   Lizbeth Bark, FNP  fluticasone (FLOVENT HFA) 44 MCG/ACT inhaler Inhale 2 puffs into the lungs 2 (two) times daily. 04/06/16   Lizbeth Bark, FNP  ibuprofen (ADVIL) 200 MG tablet Take 3 tablets (600 mg total) by mouth every 6 (six) hours as needed for mild pain or moderate pain. 08/25/16   Mack Hook, MD  montelukast (SINGULAIR) 10 MG tablet Take 1 tablet (10 mg total) by mouth at bedtime. 04/06/16   Lizbeth Bark, FNP  Multiple Vitamin (MULTIVITAMIN WITH MINERALS) TABS Take 1 tablet by mouth daily.    [provider]  ondansetron (ZOFRAN) 4 MG tablet Take 1 tablet (4 mg total) by mouth every 6 (six) hours. 03/01/18   Gilda Crease, MD  oseltamivir (TAMIFLU) 75 MG capsule Take 1 capsule (75 mg total) by mouth every 12 (twelve) hours. 03/01/18   Pollina, Canary Brim, MD  oxyCODONE (ROXICODONE) 5 MG immediate release tablet Take 1 tablet (5 mg total) by mouth every 6 (six) hours as needed for severe pain. Patient not taking: Reported on 11/26/2017 08/25/16   Mack Hook, MD  phenol (CHLORASEPTIC) 1.4 % LIQD Use as directed 1 spray in the mouth or throat as needed for throat irritation / pain. 01/24/17   Cathie Hoops, Amy V, PA-C  vitamin C (ASCORBIC ACID) 500 MG tablet Take 500 mg by mouth daily.    [provider]    Family History Family History  Problem Relation Age of Onset  . ADD / ADHD Mother   . Depression Mother     Social History Social History   Tobacco Use  . Smoking status: Current Every Day Smoker  . Smokeless tobacco: Never Used  Substance Use Topics  . Alcohol use: Yes  . Drug use: Yes    Types: Marijuana    Comment: daily use     Allergies   Patient has no known allergies.   Review of Systems Review of Systems  Constitutional: Positive for fever.  HENT: Positive for congestion and sore throat.   Respiratory: Positive for cough.   Gastrointestinal: Positive for nausea and  vomiting. Negative for diarrhea.  All other systems reviewed and are negative.    Physical Exam Updated Vital Signs BP 114/77 (BP Location: Left Arm)   Pulse 93   Temp 99.5 F (37.5 C) (Oral)   Resp 15   Ht 5' 7.5" (1.715 m)   Wt 65.8 kg   SpO2 100%   BMI 22.38 kg/m   Physical Exam Vitals signs and nursing note reviewed.  Constitutional:      General: He is not in acute distress.    Appearance: Normal appearance. He is well-developed.  HENT:     Head: Normocephalic and atraumatic.     Right Ear: Hearing normal.     Left Ear: Hearing normal.     Nose: Nose normal.  Eyes:     Conjunctiva/sclera: Conjunctivae normal.     Pupils: Pupils are equal, round, and reactive to light.  Neck:     Musculoskeletal: Normal range of motion and neck supple.  Cardiovascular:     Rate and Rhythm: Regular rhythm.     Heart sounds: S1 normal and S2 normal. No murmur. No friction rub. No gallop.   Pulmonary:     Effort: Pulmonary effort is normal. No respiratory distress.     Breath sounds: Normal breath sounds.  Chest:     Chest wall: No tenderness.  Abdominal:     General: Bowel sounds are normal.     Palpations: Abdomen is soft.     Tenderness: There is no abdominal tenderness. There is no guarding or rebound. Negative signs include Murphy's sign and McBurney's sign.     Hernia: No hernia is present.  Musculoskeletal: Normal range of motion.  Skin:    General: Skin is warm and dry.     Findings: No rash.  Neurological:     Mental Status: He is alert and oriented to person, place, and time.     GCS: GCS eye subscore is 4. GCS verbal subscore is 5. GCS motor subscore is 6.     Cranial Nerves: No cranial nerve deficit.     Sensory: No sensory deficit.     Coordination: Coordination normal.  Psychiatric:        Speech: Speech normal.        Behavior: Behavior normal.        Thought Content: Thought content normal.      ED Treatments / Results  Labs (all labs ordered are  listed, but only abnormal results are displayed) Labs Reviewed - No data to display  EKG None  Radiology No results found.  Procedures Procedures (including critical care time)  Medications Ordered in ED Medications  oseltamivir (TAMIFLU) capsule 75 mg (has no administration in time range)     Initial Impression / Assessment and Plan / ED Course  I have reviewed the triage vital signs and  the nursing notes.  Pertinent labs & imaging results that were available during my care of the patient were reviewed by me and considered in my medical decision making (see chart for details).     Patient presents with flulike illness.  He has a constellation of symptoms including nasal congestion, sore throat, cough, nausea, vomiting that are consistent with prevalent influenza in the community.  Lungs are clear, no concern for pneumonia.  Vital signs are normal including room air oxygen saturation of 100%.  Will treat empirically for flu, symptomatic Rx and give work note.  Final Clinical Impressions(s) / ED Diagnoses   Final diagnoses:  Influenza    ED Discharge Orders         Ordered    oseltamivir (TAMIFLU) 75 MG capsule  Every 12 hours     03/01/18 0520    benzonatate (TESSALON) 200 MG capsule  3 times daily PRN     03/01/18 0520    ondansetron (ZOFRAN) 4 MG tablet  Every 6 hours     03/01/18 0520           Gilda Crease, MD 03/01/18 [phone]

## 2019-01-20 IMAGING — DX DG CHEST 1V PORT
1 series · 1 of 1 positions shown · non-contrast
Comparison: Chest radiograph performed 10/05/2009

CLINICAL DATA: Acute onset of agonal breathing, pinpoint pupils and
diaphoresis. Drug overdose.

EXAM:
PORTABLE CHEST 1 VIEW

[chest ap]
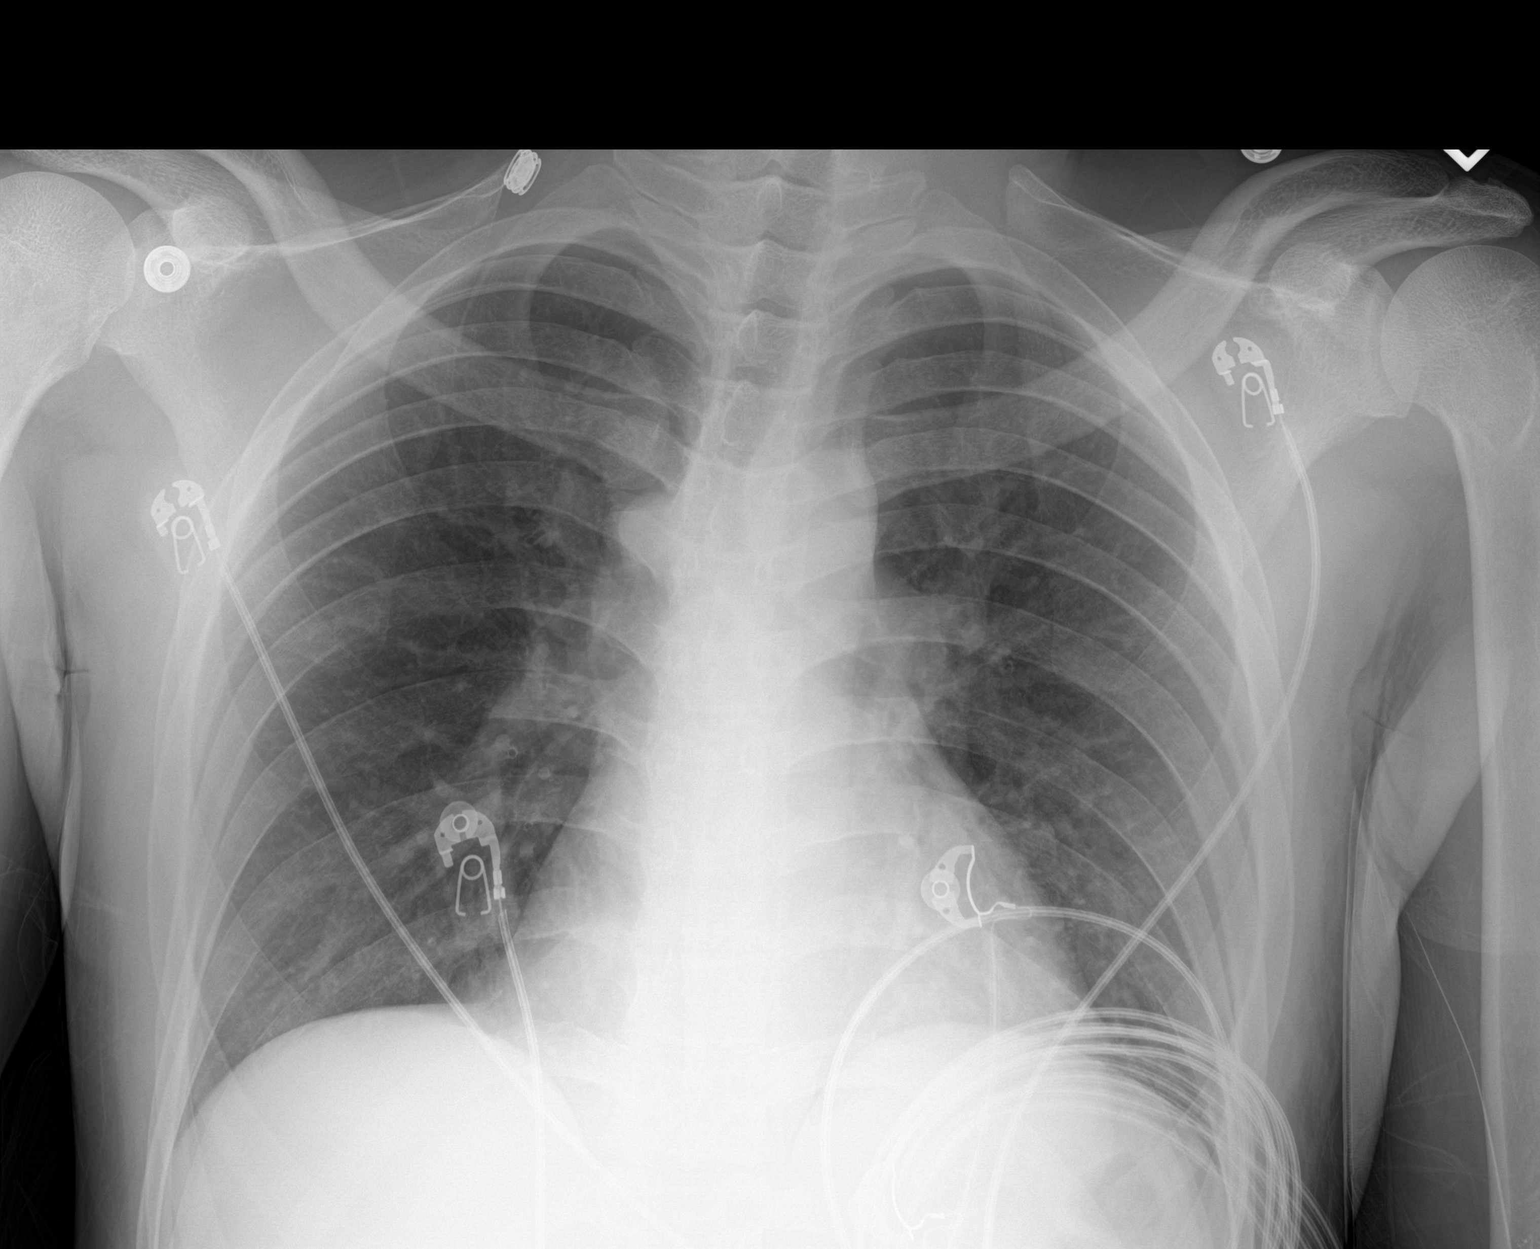

[1 of 1 positions shown; findings below may reference images not displayed]

FINDINGS: The lungs are well-aerated and clear. There is no evidence of focal
opacification, pleural effusion or pneumothorax.

The cardiomediastinal silhouette is within normal limits. No acute
osseous abnormalities are seen.
IMPRESSION: No acute cardiopulmonary process seen.

## 2022-10-04 ENCOUNTER — Emergency Department (HOSPITAL_COMMUNITY)
Admission: EM | Admit: 2022-10-04 | Discharge: 2022-10-04 | Disposition: A | Discharge status: Home / Self Care | Payer: BLUE CROSS/BLUE SHIELD | Attending: Emergency Medicine | Admitting: Emergency Medicine

## 2022-10-04 ENCOUNTER — Encounter (HOSPITAL_COMMUNITY): Payer: Self-pay

## 2022-10-04 ENCOUNTER — Other Ambulatory Visit: Payer: Self-pay

## 2022-10-04 DIAGNOSIS — J45909 Unspecified asthma, uncomplicated: Secondary | ICD-10-CM | POA: Insufficient documentation

## 2022-10-04 DIAGNOSIS — L731 Pseudofolliculitis barbae: Secondary | ICD-10-CM | POA: Insufficient documentation

## 2022-10-04 DIAGNOSIS — Z7951 Long term (current) use of inhaled steroids: Secondary | ICD-10-CM | POA: Insufficient documentation

## 2022-10-04 DIAGNOSIS — L03818 Cellulitis of other sites: Secondary | ICD-10-CM | POA: Insufficient documentation

## 2022-10-04 MED ORDER — DOXYCYCLINE HYCLATE 100 MG PO CAPS: 100.0000 mg | ORAL_CAPSULE | Freq: Two times a day (BID) | ORAL | 0 refills | Status: AC

## 2022-10-04 NOTE — ED Triage Notes (Signed)
Pt reports possible ingrown hair next to left testicle that started 2-3 days ago. Denies fever, chills, body aches

## 2022-10-04 NOTE — ED Provider Notes (Signed)
[place] EMERGENCY DEPARTMENT AT [facility] Provider Note   CSN: [id number] Arrival date & time: 10/04/22  1705     History  Chief Complaint  Patient presents with   Abscess    Christian Barajas is a 27 y.o. male with history of ADHD, oppositional defiance disorder, asthma, seasonal allergies, who presents to the emergency department complaining of a possible ingrown hair.  Patient states that he noticed this area about 2 to 3 days ago behind his left testicle.  After he got in the shower he did mess with it, and believes that he saw some pus draining from it.  Denies any fevers or chills.  Denies any swelling in the scrotum itself.   Abscess      Home Medications Prior to Admission medications   Medication Sig Start Date End Date Taking? Authorizing Provider  doxycycline (VIBRAMYCIN) 100 MG capsule Take 1 capsule (100 mg total) by mouth 2 (two) times daily. 10/04/22  Yes Gilad Dugger T, PA-C  acetaminophen (TYLENOL) 325 MG tablet Take 2 tablets (650 mg total) by mouth every 6 (six) hours as needed for mild pain or moderate pain. 08/25/16   Mack Hook, MD  albuterol (PROVENTIL HFA;VENTOLIN HFA) 108 (90 Base) MCG/ACT inhaler Inhale 2 puffs into the lungs every 6 (six) hours as needed for wheezing or shortness of breath. 04/06/16   Hairston, Oren Beckmann, FNP  benzonatate (TESSALON) 200 MG capsule Take 1 capsule (200 mg total) by mouth 3 (three) times daily as needed for cough. 03/01/18   Gilda Crease, MD  fexofenadine (ALLEGRA) 180 MG tablet Take 180 mg by mouth daily.    [provider]  fluticasone (FLONASE) 50 MCG/ACT nasal spray Place 2 sprays into both nostrils daily. 04/06/16   Lizbeth Bark, FNP  fluticasone (FLOVENT HFA) 44 MCG/ACT inhaler Inhale 2 puffs into the lungs 2 (two) times daily. 04/06/16   Lizbeth Bark, FNP  ibuprofen (ADVIL) 200 MG tablet Take 3 tablets (600 mg total) by mouth every 6 (six) hours as needed for mild pain  or moderate pain. 08/25/16   Mack Hook, MD  montelukast (SINGULAIR) 10 MG tablet Take 1 tablet (10 mg total) by mouth at bedtime. 04/06/16   Lizbeth Bark, FNP  Multiple Vitamin (MULTIVITAMIN WITH MINERALS) TABS Take 1 tablet by mouth daily.    [provider]  ondansetron (ZOFRAN) 4 MG tablet Take 1 tablet (4 mg total) by mouth every 6 (six) hours. 03/01/18   Gilda Crease, MD  oseltamivir (TAMIFLU) 75 MG capsule Take 1 capsule (75 mg total) by mouth every 12 (twelve) hours. 03/01/18   Pollina, Canary Brim, MD  oxyCODONE (ROXICODONE) 5 MG immediate release tablet Take 1 tablet (5 mg total) by mouth every 6 (six) hours as needed for severe pain. Patient not taking: Reported on 11/26/2017 08/25/16   Mack Hook, MD  phenol (CHLORASEPTIC) 1.4 % LIQD Use as directed 1 spray in the mouth or throat as needed for throat irritation / pain. 01/24/17   Cathie Hoops, Amy V, PA-C  vitamin C (ASCORBIC ACID) 500 MG tablet Take 500 mg by mouth daily.    [provider]      Allergies    Patient has no known allergies.    Review of Systems   Review of Systems  Skin:        Ingrown hair  All other systems reviewed and are negative.   Physical Exam Updated Vital Signs BP 127/79   Pulse  78   Temp 98.4 F (36.9 C) (Oral)   Resp 16   Ht 5' 7.5" (1.715 m)   Wt 74.8 kg   SpO2 100%   BMI 25.46 kg/m  Physical Exam Vitals and nursing note reviewed.  Constitutional:      Appearance: Normal appearance.  HENT:     Head: Normocephalic and atraumatic.  Eyes:     Conjunctiva/sclera: Conjunctivae normal.  Pulmonary:     Effort: Pulmonary effort is normal. No respiratory distress.  Genitourinary:      Comments: Area behind the left testicle with surrounding erythema and some edema, actively draining pus Skin:    General: Skin is warm and dry.  Neurological:     Mental Status: He is alert.  Psychiatric:        Mood and Affect: Mood normal.        Behavior: Behavior  normal.     ED Results / Procedures / Treatments   Labs (all labs ordered are listed, but only abnormal results are displayed) Labs Reviewed - No data to display  EKG None  Radiology No results found.  Procedures Procedures    Medications Ordered in ED Medications - No data to display  ED Course/ Medical Decision Making/ A&P                                 Medical Decision Making Risk Prescription drug management.   Patient is a 27 y.o. male who presents to the emergency department for an abscess. No fever or chills.   Physical exam: Area behind the left testicle with surrounding erythema, about 2 cm induration, draining pus  Disposition: Patient is not requiring admission or inpatient treatment further symptoms.  Patient was placed on course of antibiotics and instructed to have a wound check in 3 days.  Do not feel patient has abscess amenable to drainage at this time, and it is actively draining itself.  We discussed reasons to return to the emergency department, and patient is agreeable to the plan.  Final Clinical Impression(s) / ED Diagnoses Final diagnoses:  Ingrown hair  Cellulitis of other specified site    Rx / DC Orders ED Discharge Orders          Ordered    doxycycline (VIBRAMYCIN) 100 MG capsule  2 times daily        10/04/22 2039           Portions of this report may have been transcribed using voice recognition software. Every effort was made to ensure accuracy; however, inadvertent computerized transcription errors may be present.    Jeanella Flattery 10/04/22 2350    Charlynne Pander, MD 10/06/22 [phone]

## 2022-10-04 NOTE — Discharge Instructions (Signed)
You were seen in the ER for possible abscess in your groin.  As we discussed, sometimes ingrown hairs can become infected. It is reassuring that this area has opened already. We want to encourage this with warm compresses for 15-20 minutes 2-3x per day. I am prescribing you some antibiotics to help treat the surrounding skin infection. Sometimes these areas need to be opened and drained.   Continue to monitor how you're doing and return to the ER for new or worsening symptoms such as worsening pain, swelling, redness, or if you develop fever or chills.

## 2023-08-20 ENCOUNTER — Ambulatory Visit
Admission: EM | Admit: 2023-08-20 | Discharge: 2023-08-20 | Disposition: A | Discharge status: Home / Self Care | Attending: Emergency Medicine | Admitting: Emergency Medicine

## 2023-08-20 ENCOUNTER — Encounter: Payer: Self-pay | Admitting: Emergency Medicine

## 2023-08-20 DIAGNOSIS — S41111A Laceration without foreign body of right upper arm, initial encounter: Secondary | ICD-10-CM

## 2023-08-20 MED ORDER — TETANUS-DIPHTH-ACELL PERTUSSIS 5-2.5-18.5 LF-MCG/0.5 IM SUSY
0.5000 mL | PREFILLED_SYRINGE | Freq: Once | INTRAMUSCULAR | Status: AC
  Administered 2023-08-20: 0.5 mL via INTRAMUSCULAR

## 2023-08-20 NOTE — Discharge Instructions (Signed)
 The Dermabond (tissue adhesive) will come off on it's own in about a week. Try not to get it wet. You can cover with non-stick gauze to protect the area.   Monitor for any signs of infection - increased pain, redness, swelling, or foul drainage. Please return if needed  We have updated your tetanus vaccine today

## 2023-08-20 NOTE — ED Triage Notes (Addendum)
 Pt presents c/o laceration to the right wrist. Pt had an incident with a box cutter that resulted in small laceration on right wrist. Pt reports heavy bleeding at first but says it has slowed down a lot since arrival. Pt unaware of last Tetanus shot.

## 2023-08-20 NOTE — ED Provider Notes (Signed)
 [facility]    CSN: [id number] Arrival date & time: 08/20/23  1447      History   Chief Complaint Chief Complaint  Patient presents with   Laceration    HPI Christian Barajas is a 28 y.o. male.  Cut right arm with box cutter about an hour prior to arrival  Bleeding controlled Not having numbness/tingling in the fingers His last tetanus was 2018 but would like to update today  Past Medical History:  Diagnosis Date   ADHD (attention deficit hyperactivity disorder)    Asthma    Oppositional defiant disorder    Seasonal allergies     Patient Active Problem List   Diagnosis Date Noted   Asthma 04/06/2016   Seasonal allergies 04/06/2016   ADD (attention deficit disorder) without hyperactivity 01/19/2011   Oppositional defiant disorder 01/19/2011    History reviewed. No pertinent surgical history.     Home Medications    Prior to Admission medications   Medication Sig Start Date End Date Taking? Authorizing Provider  acetaminophen  (TYLENOL ) 325 MG tablet Take 2 tablets (650 mg total) by mouth every 6 (six) hours as needed for mild pain or moderate pain. 08/25/16   Sebastian Lenis, MD  albuterol  (PROVENTIL  HFA;VENTOLIN  HFA) 108 (90 Base) MCG/ACT inhaler Inhale 2 puffs into the lungs every 6 (six) hours as needed for wheezing or shortness of breath. 04/06/16   Hairston, Alston SAUNDERS, FNP  benzonatate  (TESSALON ) 200 MG capsule Take 1 capsule (200 mg total) by mouth 3 (three) times daily as needed for cough. 03/01/18   Haze Lonni PARAS, MD  doxycycline  (VIBRAMYCIN ) 100 MG capsule Take 1 capsule (100 mg total) by mouth 2 (two) times daily. 10/04/22   Roemhildt, Lorin T, PA-C  fexofenadine (ALLEGRA) 180 MG tablet Take 180 mg by mouth daily.    [provider]  fluticasone  (FLONASE ) 50 MCG/ACT nasal spray Place 2 sprays into both nostrils daily. 04/06/16   Durenda Alston SAUNDERS, FNP  fluticasone  (FLOVENT  HFA) 44 MCG/ACT inhaler Inhale 2 puffs into the lungs 2  (two) times daily. 04/06/16   Durenda Alston SAUNDERS, FNP  ibuprofen  (ADVIL ) 200 MG tablet Take 3 tablets (600 mg total) by mouth every 6 (six) hours as needed for mild pain or moderate pain. 08/25/16   Sebastian Lenis, MD  montelukast  (SINGULAIR ) 10 MG tablet Take 1 tablet (10 mg total) by mouth at bedtime. 04/06/16   Durenda Alston SAUNDERS, FNP  Multiple Vitamin (MULTIVITAMIN WITH MINERALS) TABS Take 1 tablet by mouth daily.    [provider]  ondansetron  (ZOFRAN ) 4 MG tablet Take 1 tablet (4 mg total) by mouth every 6 (six) hours. 03/01/18   Haze Lonni PARAS, MD  oseltamivir  (TAMIFLU ) 75 MG capsule Take 1 capsule (75 mg total) by mouth every 12 (twelve) hours. 03/01/18   Haze Lonni PARAS, MD  oxyCODONE  (ROXICODONE ) 5 MG immediate release tablet Take 1 tablet (5 mg total) by mouth every 6 (six) hours as needed for severe pain. Patient not taking: Reported on 11/26/2017 08/25/16   Sebastian Lenis, MD  phenol (CHLORASEPTIC) 1.4 % LIQD Use as directed 1 spray in the mouth or throat as needed for throat irritation / pain. 01/24/17   Babara, Amy V, PA-C  vitamin C (ASCORBIC ACID) 500 MG tablet Take 500 mg by mouth daily.    [provider]    Family History Family History  Problem Relation Age of Onset   ADD / ADHD Mother    Depression Mother  Social History Social History   Tobacco Use   Smoking status: Every Day    Types: Cigarettes    Passive exposure: Current   Smokeless tobacco: Never  Vaping Use   Vaping status: Never Used  Substance Use Topics   Alcohol use: Yes   Drug use: Yes    Types: Marijuana    Comment: daily use     Allergies   Patient has no known allergies.   Review of Systems Review of Systems As per HPI  Physical Exam Triage Vital Signs ED Triage Vitals  Encounter Vitals Group     BP 08/20/23 1540 117/78     Girls Systolic BP Percentile --      Girls Diastolic BP Percentile --      Boys Systolic BP Percentile --      Boys Diastolic  BP Percentile --      Pulse Rate 08/20/23 1540 81     Resp 08/20/23 1540 16     Temp 08/20/23 1540 98.3 F (36.8 C)     Temp Source 08/20/23 1540 Oral     SpO2 08/20/23 1540 99 %     Weight 08/20/23 1538 174 lb 11.2 oz (79.2 kg)     Height --      Head Circumference --      Peak Flow --      Pain Score 08/20/23 1537 6     Pain Loc --      Pain Education --      Exclude from Growth Chart --    No data found.  Updated Vital Signs BP 117/78 (BP Location: Left Arm)   Pulse 81   Temp 98.3 F (36.8 C) (Oral)   Resp 16   Wt 174 lb 11.2 oz (79.2 kg)   SpO2 99%   BMI 26.96 kg/m   Physical Exam Vitals and nursing note reviewed.  Constitutional:      General: He is not in acute distress.    Appearance: Normal appearance.  HENT:     Mouth/Throat:     Pharynx: Oropharynx is clear.  Cardiovascular:     Rate and Rhythm: Normal rate and regular rhythm.     Pulses: Normal pulses.     Heart sounds: Normal heart sounds.  Pulmonary:     Effort: Pulmonary effort is normal.     Breath sounds: Normal breath sounds.  Abdominal:     Palpations: Abdomen is soft.  Musculoskeletal:     Comments: Full ROM of wrist and all joints of fingers. Distal sensation intact. Cap refill < 2 seconds. Radial pulse 2+. Fingers are warm   Skin:    Comments: Small (<0.5 cm) laceration on the right distal forearm. Not bleeding. Fairly well approximated.   Neurological:     Mental Status: He is alert and oriented to person, place, and time.     UC Treatments / Results  Labs (all labs ordered are listed, but only abnormal results are displayed) Labs Reviewed - No data to display  EKG  Radiology No results found.  Procedures Laceration Repair  Date/Time: 08/20/2023 5:32 PM  Performed by: Jeryl Stabs, PA-C Authorized by: Jeryl Stabs, PA-C   Consent:    Consent obtained:  Verbal   Consent given by:  Patient   Risks, benefits, and alternatives were discussed: yes     Risks discussed:   Infection, pain, need for additional repair, poor cosmetic result and poor wound healing Universal protocol:    Procedure explained and questions  answered to patient or proxy's satisfaction: yes     Patient identity confirmed:  Verbally with patient Anesthesia:    Anesthesia method:  None Laceration details:    Location:  Shoulder/arm   Shoulder/arm location:  R lower arm   Length (cm):  0.5   Depth (mm):  0.3 Exploration:    Hemostasis achieved with:  Direct pressure   Wound exploration: entire depth of wound visualized   Treatment:    Area cleansed with:  Shur-Clens and saline   Amount of cleaning:  Standard Skin repair:    Repair method:  Tissue adhesive Repair type:    Repair type:  Simple Post-procedure details:    Dressing:  Non-adherent dressing   Procedure completion:  Tolerated  (including critical care time)  Medications Ordered in UC Medications  Tdap (BOOSTRIX ) injection 0.5 mL (0.5 mLs Intramuscular Given 08/20/23 1634)    Initial Impression / Assessment and Plan / UC Course  I have reviewed the triage vital signs and the nursing notes.  Pertinent labs & imaging results that were available during my care of the patient were reviewed by me and considered in my medical decision making (see chart for details).  Small well approximated laceration on the right distal arm Neurologically intact hand  Cleansed in clinic Dermabond for closure, nonstick dressing applied Updated Tdap today per patient request  Advised wound care and signs of infection to monitor for No questions at this time  Final Clinical Impressions(s) / UC Diagnoses   Final diagnoses:  Arm laceration, right, initial encounter     Discharge Instructions      The Dermabond (tissue adhesive) will come off on it's own in about a week. Try not to get it wet. You can cover with non-stick gauze to protect the area.   Monitor for any signs of infection - increased pain, redness, swelling, or foul  drainage. Please return if needed  We have updated your tetanus vaccine today       ED Prescriptions   None    PDMP not reviewed this encounter.   Jeryl Stabs, PA-C 08/20/23 1734
